# Patient Record
Sex: Male | Born: 1955 | Race: White | Hispanic: No | Marital: Married | Smoking: Current every day smoker
Health system: Southern US, Community
[De-identification: ages and names within clinical notes are randomized; demographics above are authoritative.]

## PROBLEM LIST (undated history)

## (undated) DIAGNOSIS — M1990 Unspecified osteoarthritis, unspecified site: Secondary | ICD-10-CM

## (undated) DIAGNOSIS — I10 Essential (primary) hypertension: Secondary | ICD-10-CM

## (undated) HISTORY — PX: CHOLECYSTECTOMY: SHX55

---

## 2018-10-23 ENCOUNTER — Inpatient Hospital Stay (HOSPITAL_COMMUNITY)
Admission: EM | Admit: 2018-10-23 | Discharge: 2018-10-25 | DRG: 641 | Disposition: A | Discharge status: Home / Self Care | Payer: No Typology Code available for payment source | Attending: Internal Medicine | Admitting: Internal Medicine

## 2018-10-23 ENCOUNTER — Emergency Department (HOSPITAL_COMMUNITY): Payer: No Typology Code available for payment source

## 2018-10-23 ENCOUNTER — Encounter (HOSPITAL_COMMUNITY): Payer: Self-pay | Admitting: Emergency Medicine

## 2018-10-23 ENCOUNTER — Other Ambulatory Visit: Payer: Self-pay

## 2018-10-23 DIAGNOSIS — F17210 Nicotine dependence, cigarettes, uncomplicated: Secondary | ICD-10-CM | POA: Diagnosis present

## 2018-10-23 DIAGNOSIS — R001 Bradycardia, unspecified: Secondary | ICD-10-CM | POA: Diagnosis present

## 2018-10-23 DIAGNOSIS — I10 Essential (primary) hypertension: Secondary | ICD-10-CM | POA: Diagnosis present

## 2018-10-23 DIAGNOSIS — E871 Hypo-osmolality and hyponatremia: Secondary | ICD-10-CM | POA: Diagnosis present

## 2018-10-23 DIAGNOSIS — E878 Other disorders of electrolyte and fluid balance, not elsewhere classified: Secondary | ICD-10-CM | POA: Diagnosis present

## 2018-10-23 DIAGNOSIS — Z1159 Encounter for screening for other viral diseases: Secondary | ICD-10-CM

## 2018-10-23 DIAGNOSIS — E861 Hypovolemia: Secondary | ICD-10-CM | POA: Diagnosis present

## 2018-10-23 DIAGNOSIS — N179 Acute kidney failure, unspecified: Secondary | ICD-10-CM

## 2018-10-23 DIAGNOSIS — R296 Repeated falls: Secondary | ICD-10-CM | POA: Diagnosis present

## 2018-10-23 DIAGNOSIS — E876 Hypokalemia: Secondary | ICD-10-CM

## 2018-10-23 DIAGNOSIS — E860 Dehydration: Secondary | ICD-10-CM | POA: Diagnosis present

## 2018-10-23 DIAGNOSIS — W19XXXA Unspecified fall, initial encounter: Secondary | ICD-10-CM

## 2018-10-23 DIAGNOSIS — K219 Gastro-esophageal reflux disease without esophagitis: Secondary | ICD-10-CM | POA: Diagnosis present

## 2018-10-23 HISTORY — DX: Essential (primary) hypertension: I10

## 2018-10-23 HISTORY — DX: Unspecified osteoarthritis, unspecified site: M19.90

## 2018-10-23 LAB — ETHANOL: Alcohol, Ethyl (B): <10 mg/dL (ref <10)

## 2018-10-23 LAB — CBC
HCT: 37.2 % — ABNORMAL LOW (ref 39.0–52.0)
Hemoglobin: 13.7 g/dL (ref 13.0–17.0)
MCH: 33.2 pg (ref 26.0–34.0)
MCHC: 36.8 g/dL — ABNORMAL HIGH (ref 30.0–36.0)
MCV: 90.1 fL (ref 80.0–100.0)
Platelets: 189 10*3/uL (ref 150–400)
RBC: 4.13 MIL/uL — ABNORMAL LOW (ref 4.22–5.81)
RDW: 11.7 % (ref 11.5–15.5)
WBC: 8.8 10*3/uL (ref 4.0–10.5)
nRBC: 0 % (ref 0.0–0.2)

## 2018-10-23 LAB — HEPATIC FUNCTION PANEL
ALT: 47 U/L — ABNORMAL HIGH (ref 0–44)
AST: 47 U/L — ABNORMAL HIGH (ref 15–41)
Albumin: 3.8 g/dL (ref 3.5–5.0)
Alkaline Phosphatase: 115 U/L (ref 38–126)
Bilirubin, Direct: 0.2 mg/dL (ref 0.0–0.2)
Indirect Bilirubin: 1 mg/dL — ABNORMAL HIGH (ref 0.3–0.9)
Total Bilirubin: 1.2 mg/dL (ref 0.3–1.2)
Total Protein: 6.5 g/dL (ref 6.5–8.1)

## 2018-10-23 LAB — LIPASE, BLOOD: Lipase: 21 U/L (ref 11–51)

## 2018-10-23 LAB — BASIC METABOLIC PANEL
Anion gap: 13 (ref 5–15)
BUN: 21 mg/dL (ref 8–23)
CO2: 30 mmol/L (ref 22–32)
Calcium: 8.8 mg/dL — ABNORMAL LOW (ref 8.9–10.3)
Chloride: 74 mmol/L — ABNORMAL LOW (ref 98–111)
Creatinine, Ser: 1.82 mg/dL — ABNORMAL HIGH (ref 0.61–1.24)
GFR calc Af Amer: 45 mL/min — ABNORMAL LOW (ref >60)
GFR calc non Af Amer: 39 mL/min — ABNORMAL LOW (ref >60)
Glucose, Bld: 110 mg/dL — ABNORMAL HIGH (ref 70–99)
Potassium: 2.7 mmol/L — CL (ref 3.5–5.1)
Sodium: 117 mmol/L — CL (ref 135–145)

## 2018-10-23 LAB — SARS CORONAVIRUS 2 BY RT PCR (HOSPITAL ORDER, PERFORMED IN ~~LOC~~ HOSPITAL LAB): SARS Coronavirus 2: NEGATIVE

## 2018-10-23 MED ORDER — SODIUM CHLORIDE 0.9 % IV BOLUS
1000.0000 mL | Freq: Once | INTRAVENOUS | Status: AC
  Administered 2018-10-23: 1000 mL via INTRAVENOUS

## 2018-10-23 MED ORDER — SODIUM CHLORIDE 0.9 % IV BOLUS
1000.0000 mL | Freq: Once | INTRAVENOUS | Status: AC
  Administered 2018-10-24: 1000 mL via INTRAVENOUS

## 2018-10-23 NOTE — H&P (Addendum)
History and Physical    Michael Carson XEN:[id number] DOB: 1955/12/11 DOA: 10/23/2018  PCP: Michael Son., MD Patient coming from: Home  Chief Complaint: Generalized weakness, falls  HPI: Michael Carson is a 63 y.o. male with medical history significant of arthritis, hypertension presenting to the hospital for evaluation of generalized weakness and falls.  Patient states for the past 1 week he has been feeling very weak and tired.  Today he fell 4 times and during 1 of the falls hit the back of his head.  Denies having any headaches or neck pain.  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  He did feel lightheaded today before falling.  States he has previously been told by his doctor that his sodium and potassium were low.  He was told his sodium was low because he drinks too much water.  Denies any fevers, chills, chest pain, shortness of breath, palpitations, nausea, vomiting, abdominal pain, or diarrhea.  Denies any seizures.  ED Course: Heart rate in the 40s to 50s, remainder of vitals stable.  No leukocytosis.  Hemoglobin normal.  Sodium 117.  Potassium 2.7.  Chloride 74.  Blood glucose 110.  BUN 21, creatinine 1.8.  No prior labs for comparison.  UA pending.  Blood ethanol level <10.  Lipase normal.  AST and ALT borderline elevated.  Alk phos and T bili normal.  COVID-19 rapid test negative.  Chest x-ray showing no active disease.  Head CT negative for acute finding.  Patient received a 1 L fluid bolus in the ED.  Review of Systems:  All systems reviewed and apart from history of presenting illness, are negative.  Past Medical History:  Diagnosis Date   Arthritis    Hypertension     Past Surgical History:  Procedure Laterality Date   CHOLECYSTECTOMY       reports that he has been smoking. He has been smoking about 2.00 packs per day. He has never used smokeless tobacco. He reports current alcohol use. He reports previous drug  use.  No Known Allergies  History reviewed. No pertinent family history.  Prior to Admission medications   Not on File    Physical Exam: Vitals:   10/23/18 2300 10/24/18 0015 10/24/18 0030 10/24/18 0100  BP: (!) 144/73 (!) 151/70 (!) 153/69 (!) 173/77  Pulse: (!) 51 (!) 52 (!) 46 (!) 49  Resp: '20 12 14 16  '$ Temp:      TempSrc:      SpO2: 100% 98% 97% 100%  Weight:      Height:        Physical Exam  Constitutional: He is oriented to person, place, and time. He appears well-developed and well-nourished. No distress.  HENT:  Head: Normocephalic.  Dry mucous membranes  Eyes: Right eye exhibits no discharge. Left eye exhibits no discharge.  Neck: Neck supple.  Cardiovascular: Normal rate, regular rhythm and intact distal pulses.  Pulmonary/Chest: Effort normal and breath sounds normal. No respiratory distress. He has no wheezes. He has no rales.  Abdominal: Soft. Bowel sounds are normal. He exhibits no distension. There is no abdominal tenderness. There is no guarding.  Musculoskeletal:        General: No edema.  Neurological: He is alert and oriented to person, place, and time.  Answering questions and following commands appropriately Strength 5 out of 5 in bilateral upper and lower extremities.  Skin: Skin is warm and dry. He is not diaphoretic.  Labs on Admission: I have personally reviewed following labs and imaging studies  CBC: Recent Labs  Lab 10/23/18 1943  WBC 8.8  HGB 13.7  HCT 37.2*  MCV 90.1  PLT 782   Basic Metabolic Panel: Recent Labs  Lab 10/23/18 1943 10/24/18 0115 10/24/18 0201  NA 117* 120* 122*  K 2.7* 2.6* 3.0*  CL 74* 81* 82*  CO2 '30 26 29  '$ GLUCOSE 110* 98 98  BUN '21 14 13  '$ CREATININE 1.82* 1.31* 1.26*  CALCIUM 8.8* 8.1* 8.5*  MG  --   --  1.5*   GFR: Estimated Creatinine Clearance: 62.7 mL/min (A) (by C-G formula based on SCr of 1.26 mg/dL (H)). Liver Function Tests: Recent Labs  Lab 10/23/18 2100  AST 47*  ALT 47*   ALKPHOS 115  BILITOT 1.2  PROT 6.5  ALBUMIN 3.8   Recent Labs  Lab 10/23/18 2100  LIPASE 21   No results for input(s): AMMONIA in the last 168 hours. Coagulation Profile: No results for input(s): INR, PROTIME in the last 168 hours. Cardiac Enzymes: No results for input(s): CKTOTAL, CKMB, CKMBINDEX, TROPONINI in the last 168 hours. BNP (last 3 results) No results for input(s): PROBNP in the last 8760 hours. HbA1C: No results for input(s): HGBA1C in the last 72 hours. CBG: No results for input(s): GLUCAP in the last 168 hours. Lipid Profile: No results for input(s): CHOL, HDL, LDLCALC, TRIG, CHOLHDL, LDLDIRECT in the last 72 hours. Thyroid Function Tests: Recent Labs    10/24/18 0201 10/24/18 0202  TSH  --  1.060  FREET4 1.33*  --    Anemia Panel: No results for input(s): VITAMINB12, FOLATE, FERRITIN, TIBC, IRON, RETICCTPCT in the last 72 hours. Urine analysis:    Component Value Date/Time   COLORURINE STRAW (A) 10/24/2018 0115   APPEARANCEUR CLEAR 10/24/2018 0115   LABSPEC 1.003 (L) 10/24/2018 0115   PHURINE 6.0 10/24/2018 0115   GLUCOSEU NEGATIVE 10/24/2018 0115   HGBUR SMALL (A) 10/24/2018 0115   BILIRUBINUR NEGATIVE 10/24/2018 0115   KETONESUR NEGATIVE 10/24/2018 0115   PROTEINUR NEGATIVE 10/24/2018 0115   NITRITE NEGATIVE 10/24/2018 0115   LEUKOCYTESUR NEGATIVE 10/24/2018 0115    Radiological Exams on Admission: Ct Head Wo Contrast  Result Date: 10/23/2018 CLINICAL DATA:  Altered LOC EXAM: CT HEAD WITHOUT CONTRAST TECHNIQUE: Contiguous axial images were obtained from the base of the skull through the vertex without intravenous contrast. COMPARISON:  None. FINDINGS: Brain: No acute territorial infarction, hemorrhage or intracranial mass. Patchy hypodensity in the periventricular and subcortical white matter consistent with small vessel ischemic change. Normal ventricle size. Vascular: No hyperdense vessels.  Carotid vascular calcification Skull: Normal. Negative  for fracture or focal lesion. Sinuses/Orbits: No acute finding. Other: None IMPRESSION: 1. No CT evidence for acute intracranial abnormality. 2. Scattered hypodensity in the bilateral white matter suspected to represent small vessel ischemic change Electronically Signed   By: Donavan Foil M.D.   On: 10/23/2018 22:18   Dg Chest Port 1 View  Result Date: 10/23/2018 CLINICAL DATA:  Weakness. EXAM: PORTABLE CHEST 1 VIEW COMPARISON:  CT dated June 29, 2008. FINDINGS: A metallic coil projects over the left mid chest. 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 There is slight increased density surrounding the AVM coils. There is no pneumothorax. No large pleural effusion. No acute osseous abnormality. The heart is stable. IMPRESSION: No active disease. Electronically Signed   By: Constance Holster M.D.   On: 10/23/2018  21:15    EKG: Independently reviewed.  Sinus bradycardia (heart rate 49), no prior EKG for comparison.  Assessment/Plan Principal Problem:   Hyponatremia Active Problems:   Hypokalemia   Hypomagnesemia   Hypochloremia   AKI (acute kidney injury) (HCC)   Severe hypotonic hypovolemic hyponatremia Sodium 117.  No altered mental status or seizures.  Serum osmolality 256, urine sodium 18, urine osmolality 123.  No vomiting or diarrhea.  Likely related to home thiazide diuretic use. -IV fluid hydration -Goal sodium correction 4 to 6 mEq in the next 24 hours. -Stop thiazide diuretic -Monitor BMP every 4 hours -Seizure precautions  Hypokalemia, hypomagnesemia No vomiting or diarrhea.  Hypokalemia likely related to diuretic use and hypomagnesemia.  Potassium 2.7, magnesium 1.5.  EKG without acute changes seen with hypokalemia. -Cardiac monitoring -Replete potassium.  Check magnesium level.  Continue to monitor BMP.  Hypochloremia Chloride 74.  Possibly related to diuretic use.  No vomiting. -IV fluid hydration.  Continue to  monitor.  AKI BUN 21, creatinine 1.8.  No prior labs for comparison.  Likely prerenal from dehydration. -IV fluid hydration -Avoid nephrotoxic agents -Continue to monitor renal function -Monitor urine output  Sinus bradycardia Heart rate in the 40s to 50s.  Unclear what his baseline is.  He is not on a beta-blocker or calcium channel blocker.  TSH 1.06 and free T4 1.33.  Likely also contributing to fatigue. -Cardiac monitoring  Falls, generalized weakness Likely related to severe hyponatremia and possibly due to bradycardia.  Head CT negative for acute finding. -PT evaluation  Hypertension Systolic in the 132G to 401U. -Hydralazine PRN -Stop thiazide diuretic given severe hyponatremia  GERD -PPI  DVT prophylaxis: Lovenox Code Status: Patient wishes to be full code. Family Communication: No family available at this time. Disposition Plan: Anticipate discharge after clinical improvement. Consults called: None Admission status: It is my clinical opinion that admission to INPATIENT is reasonable and necessary in this 63 y.o. male  presenting with severe hyponatremia, hypokalemia, AKI, bradycardia, generalized weakness, and falls.  Will need IV fluid hydration for gradual correction of sodium.  Given the aforementioned, the predictability of an adverse outcome is felt to be significant. I expect that the patient will require at least 2 midnights in the hospital to treat this condition.   The medical decision making on this patient was of high complexity and the patient is at high risk for clinical deterioration, therefore this is a level 3 visit.  Shela Leff MD [facility] Pager [phone]  If 7PM-7AM, please contact night-coverage [email or web address] Password [facility]  10/24/2018, 4:46 AM

## 2018-10-23 NOTE — ED Provider Notes (Signed)
[facility] Provider Note   CSN: [id number] Arrival date & time: 10/23/18  1936    History   Chief Complaint Chief Complaint  Patient presents with  . Weakness    HPI Michael Carson is a 63 y.o. male.     HPI Patient presents with weakness and falls. Patient has a history of hypertension, but states that he is generally well. He works as a Systems analystfurniture restorer. Today, he is felt progressively weak, though he notes that he had similar mild weakness earlier in the week. However, today with persistent mild generalized weakness without focality has had several falls. 1 of these included struck the back of his head, but there is no loss of consciousness. He notes that he is having difficulty with focusing, but does not feel overtly confused, denies a speech difficulty, though this has been reported by others to EMS providers. Today after a series of increasingly frequent falls, he was sent here for evaluation. He denies focal pain anywhere, including the back of his head, or neck.  Past Medical History:  Diagnosis Date  . Arthritis   . Hypertension     Patient Active Problem List   Diagnosis Date Noted  . Hyponatremia 10/23/2018    Past Surgical History:  Procedure Laterality Date  . CHOLECYSTECTOMY          Home Medications    Prior to Admission medications   Not on File    Family History History reviewed. No pertinent family history.  Social History Social History   Tobacco Use  . Smoking status: Current Every Day Smoker    Packs/day: 2.00  . Smokeless tobacco: Never Used  Substance Use Topics  . Alcohol use: Yes    Comment: occas  . Drug use: Not Currently     Allergies   Patient has no known allergies.   Review of Systems Review of Systems  Constitutional:       Per HPI, otherwise negative  HENT:       Per HPI, otherwise negative  Respiratory:       Per HPI, otherwise negative  Cardiovascular:     Per HPI, otherwise negative  Gastrointestinal: Negative for vomiting.  Endocrine:       Negative aside from HPI  Genitourinary:       Neg aside from HPI   Musculoskeletal:       Per HPI, otherwise negative  Skin: Negative.   Neurological: Positive for weakness. Negative for syncope.     Physical Exam Updated Vital Signs BP (!) 142/67   Pulse (!) 48   Temp 98.7 F (37.1 C) (Oral)   Resp 18   Ht 5\' 11"  (1.803 m)   Wt 73.9 kg   SpO2 98%   BMI 22.73 kg/m   Physical Exam Vitals signs and nursing note reviewed.  Constitutional:      General: He is not in acute distress.    Appearance: He is well-developed.  HENT:     Head: Normocephalic and atraumatic.  Eyes:     Conjunctiva/sclera: Conjunctivae normal.  Neck:     Musculoskeletal: No neck rigidity or muscular tenderness.  Cardiovascular:     Rate and Rhythm: Normal rate and regular rhythm.  Pulmonary:     Effort: Pulmonary effort is normal. No respiratory distress.     Breath sounds: No stridor.  Abdominal:     General: There is no distension.  Skin:    General: Skin is warm and dry.  Neurological:     Mental Status: He is alert and oriented to person, place, and time.     Cranial Nerves: Cranial nerves are intact.     Motor: Motor function is intact.     Coordination: Coordination is intact.     Comments: Patient confabulating, words are repetitive, storytelling is tangential.      ED Treatments / Results  Labs (all labs ordered are listed, but only abnormal results are displayed) Labs Reviewed  BASIC METABOLIC PANEL - Abnormal; Notable for the following components:      Result Value   Sodium 117 (*)    Potassium 2.7 (*)    Chloride 74 (*)    Glucose, Bld 110 (*)    Creatinine, Ser 1.82 (*)    Calcium 8.8 (*)    GFR calc non Af Amer 39 (*)    GFR calc Af Amer 45 (*)    All other components within normal limits  CBC - Abnormal; Notable for the following components:   RBC 4.13 (*)    HCT 37.2 (*)     MCHC 36.8 (*)    All other components within normal limits  HEPATIC FUNCTION PANEL - Abnormal; Notable for the following components:   AST 47 (*)    ALT 47 (*)    Indirect Bilirubin 1.0 (*)    All other components within normal limits  SARS CORONAVIRUS 2 (HOSPITAL ORDER, [place])  ETHANOL  LIPASE, BLOOD  URINALYSIS, ROUTINE W REFLEX MICROSCOPIC  BASIC METABOLIC PANEL    EKG EKG Interpretation  Date/Time:  Wednesday October 23 2018 19:37:19 EDT Ventricular Rate:  49 PR Interval:    QRS Duration: 106 QT Interval:  470 QTC Calculation: 425 R Axis:   60 Text Interpretation:  Sinus bradycardia Consider left atrial enlargement Anterior infarct, old Abnormal ECG Confirmed by Carmin Muskrat [phone]) on 10/23/2018 7:39:40 PM   Radiology Ct Head Wo Contrast  Result Date: 10/23/2018 CLINICAL DATA:  Altered LOC EXAM: CT HEAD WITHOUT CONTRAST TECHNIQUE: Contiguous axial images were obtained from the base of the skull through the vertex without intravenous contrast. COMPARISON:  None. FINDINGS: Brain: No acute territorial infarction, hemorrhage or intracranial mass. Patchy hypodensity in the periventricular and subcortical white matter consistent with small vessel ischemic change. Normal ventricle size. Vascular: No hyperdense vessels.  Carotid vascular calcification Skull: Normal. Negative for fracture or focal lesion. Sinuses/Orbits: No acute finding. Other: None IMPRESSION: 1. No CT evidence for acute intracranial abnormality. 2. Scattered hypodensity in the bilateral white matter suspected to represent small vessel ischemic change Electronically Signed   By: Donavan Foil M.D.   On: 10/23/2018 22:18   Dg Chest Port 1 View  Result Date: 10/23/2018 CLINICAL DATA:  Weakness. EXAM: PORTABLE CHEST 1 VIEW COMPARISON:  CT dated June 29, 2008. FINDINGS: A metallic coil projects over the left mid chest. This likely is related to the patient's previously noted pulmonary AVM on  a CT from 2010. There is slight increased density surrounding the AVM coils. There is no pneumothorax. No large pleural effusion. No acute osseous abnormality. The heart is stable. IMPRESSION: No active disease. Electronically Signed   By: Constance Holster M.D.   On: 10/23/2018 21:15    Procedures Procedures (including critical care time)  Medications Ordered in ED Medications  sodium chloride 0.9 % bolus 1,000 mL (1,000 mLs Intravenous New Bag/Given 10/23/18 2234)     Initial Impression / Assessment and Plan / ED Course  I have  reviewed the triage vital signs and the nursing notes.  Pertinent labs & imaging results that were available during my care of the patient were reviewed by me and considered in my medical decision making (see chart for details).    Initial labs notable for hyponatremia  No immediate clear etiology, additional labs pending.    11:08 PM Remaining labs concerning for elevated creatinine, minor elevation in potassium. On repeat exam is in similar condition, though appears somewhat more comfortable.  Chest x-ray, head CT both largely unremarkable.  Patient has received 2 L fluid resuscitation, will require additional labs to ensure ongoing sodium correction, with consideration of switch to more concentrated version should there not be substantial improvement. This adult male presents with progressive weakness over the past few days, including sufficient degree to contribute of falls. Patient has no overt evidence for trauma, and head CT is unremarkable. However, with critically abnormal sodium value, confusion, weakness, the patient required fluid resuscitation, will require ongoing as well, with close monitoring. Patient will require stepdown admission. Hospitalist aware the patient, patient ready for admission.   Final Clinical Impressions(s) / ED Diagnoses  Fall, initial encounter Hyponatremia  CRITICAL CARE Performed by: Gerhard Munchobert Syd Manges Total critical  care time: 35 minutes Critical care time was exclusive of separately billable procedures and treating other patients. Critical care was necessary to treat or prevent imminent or life-threatening deterioration. Critical care was time spent personally by me on the following activities: development of treatment plan with patient and/or surrogate as well as nursing, discussions with consultants, evaluation of patient's response to treatment, examination of patient, obtaining history from patient or surrogate, ordering and performing treatments and interventions, ordering and review of laboratory studies, ordering and review of radiographic studies, pulse oximetry and re-evaluation of patient's condition.    Gerhard MunchLockwood, Elliemae Braman, MD 10/23/18 2310

## 2018-10-23 NOTE — ED Notes (Signed)
Patient transported to CT 

## 2018-10-23 NOTE — ED Triage Notes (Signed)
BIB EMS from home with c/o of lower extremity weakness X 2 days. Pt reports he does work in a hot environment which may be a factor. States he use to drink but not much anymore. Reports drinking Sunday but nothing the past 2 days. A&O X 4. Only c/o is pain to left rib cage resulting from fall. VSS.

## 2018-10-24 DIAGNOSIS — N179 Acute kidney failure, unspecified: Secondary | ICD-10-CM

## 2018-10-24 DIAGNOSIS — E876 Hypokalemia: Secondary | ICD-10-CM

## 2018-10-24 DIAGNOSIS — E878 Other disorders of electrolyte and fluid balance, not elsewhere classified: Secondary | ICD-10-CM

## 2018-10-24 LAB — BASIC METABOLIC PANEL
Anion gap: 13 (ref 5–15)
Anion gap: 11 (ref 5–15)
Anion gap: 10 (ref 5–15)
Anion gap: 10 (ref 5–15)
BUN: 14 mg/dL (ref 8–23)
BUN: 13 mg/dL (ref 8–23)
BUN: 11 mg/dL (ref 8–23)
BUN: 11 mg/dL (ref 8–23)
CO2: 26 mmol/L (ref 22–32)
CO2: 29 mmol/L (ref 22–32)
CO2: 27 mmol/L (ref 22–32)
CO2: 27 mmol/L (ref 22–32)
Calcium: 8.1 mg/dL — ABNORMAL LOW (ref 8.9–10.3)
Calcium: 8.5 mg/dL — ABNORMAL LOW (ref 8.9–10.3)
Calcium: 8.5 mg/dL — ABNORMAL LOW (ref 8.9–10.3)
Calcium: 9.1 mg/dL (ref 8.9–10.3)
Chloride: 81 mmol/L — ABNORMAL LOW (ref 98–111)
Chloride: 82 mmol/L — ABNORMAL LOW (ref 98–111)
Chloride: 90 mmol/L — ABNORMAL LOW (ref 98–111)
Chloride: 94 mmol/L — ABNORMAL LOW (ref 98–111)
Creatinine, Ser: 1.31 mg/dL — ABNORMAL HIGH (ref 0.61–1.24)
Creatinine, Ser: 1.26 mg/dL — ABNORMAL HIGH (ref 0.61–1.24)
Creatinine, Ser: 1.11 mg/dL (ref 0.61–1.24)
Creatinine, Ser: 0.85 mg/dL (ref 0.61–1.24)
GFR calc Af Amer: >60 mL/min (ref >60)
GFR calc Af Amer: >60 mL/min (ref >60)
GFR calc Af Amer: >60 mL/min (ref >60)
GFR calc Af Amer: >60 mL/min (ref >60)
GFR calc non Af Amer: 58 mL/min — ABNORMAL LOW (ref >60)
GFR calc non Af Amer: >60 mL/min (ref >60)
GFR calc non Af Amer: >60 mL/min (ref >60)
GFR calc non Af Amer: >60 mL/min (ref >60)
Glucose, Bld: 98 mg/dL (ref 70–99)
Glucose, Bld: 98 mg/dL (ref 70–99)
Glucose, Bld: 182 mg/dL — ABNORMAL HIGH (ref 70–99)
Glucose, Bld: 114 mg/dL — ABNORMAL HIGH (ref 70–99)
Potassium: 2.6 mmol/L — CL (ref 3.5–5.1)
Potassium: 3 mmol/L — ABNORMAL LOW (ref 3.5–5.1)
Potassium: 3.7 mmol/L (ref 3.5–5.1)
Potassium: 3.6 mmol/L (ref 3.5–5.1)
Sodium: 120 mmol/L — ABNORMAL LOW (ref 135–145)
Sodium: 122 mmol/L — ABNORMAL LOW (ref 135–145)
Sodium: 127 mmol/L — ABNORMAL LOW (ref 135–145)
Sodium: 131 mmol/L — ABNORMAL LOW (ref 135–145)

## 2018-10-24 LAB — URINALYSIS, ROUTINE W REFLEX MICROSCOPIC
Bacteria, UA: NONE SEEN
Bilirubin Urine: NEGATIVE
Glucose, UA: NEGATIVE mg/dL
Ketones, ur: NEGATIVE mg/dL
Leukocytes,Ua: NEGATIVE
Nitrite: NEGATIVE
Protein, ur: NEGATIVE mg/dL
Specific Gravity, Urine: 1.003 — ABNORMAL LOW (ref 1.005–1.030)
pH: 6 (ref 5.0–8.0)

## 2018-10-24 LAB — OSMOLALITY, URINE: Osmolality, Ur: 123 mOsm/kg — ABNORMAL LOW (ref 300–900)

## 2018-10-24 LAB — OSMOLALITY: Osmolality: 256 mOsm/kg — ABNORMAL LOW (ref 275–295)

## 2018-10-24 LAB — TSH: TSH: 1.06 u[IU]/mL (ref 0.350–4.500)

## 2018-10-24 LAB — MAGNESIUM: Magnesium: 1.5 mg/dL — ABNORMAL LOW (ref 1.7–2.4)

## 2018-10-24 LAB — MRSA PCR SCREENING: MRSA by PCR: NEGATIVE

## 2018-10-24 LAB — T4, FREE: Free T4: 1.33 ng/dL — ABNORMAL HIGH (ref 0.61–1.12)

## 2018-10-24 LAB — SODIUM, URINE, RANDOM: Sodium, Ur: 18 mmol/L

## 2018-10-24 LAB — HIV ANTIBODY (ROUTINE TESTING W REFLEX): HIV Screen 4th Generation wRfx: NONREACTIVE

## 2018-10-24 MED ORDER — PANTOPRAZOLE SODIUM 40 MG PO TBEC
40.0000 mg | DELAYED_RELEASE_TABLET | Freq: Every day | ORAL | Status: DC
  Administered 2018-10-24 – 2018-10-25 (×2): 40 mg via ORAL
  Filled 2018-10-24 (×2): qty 1

## 2018-10-24 MED ORDER — POTASSIUM CHLORIDE CRYS ER 20 MEQ PO TBCR
40.0000 meq | EXTENDED_RELEASE_TABLET | ORAL | Status: AC
  Administered 2018-10-24 (×2): 40 meq via ORAL
  Filled 2018-10-24 (×2): qty 2

## 2018-10-24 MED ORDER — ADULT MULTIVITAMIN W/MINERALS CH
1.0000 | ORAL_TABLET | Freq: Every day | ORAL | Status: DC
  Administered 2018-10-24 – 2018-10-25 (×2): 1 via ORAL
  Filled 2018-10-24 (×2): qty 1

## 2018-10-24 MED ORDER — HYDRALAZINE HCL 20 MG/ML IJ SOLN: 5.0000 mg | INTRAMUSCULAR | Status: DC | PRN

## 2018-10-24 MED ORDER — ACETAMINOPHEN 325 MG PO TABS
650.0000 mg | ORAL_TABLET | Freq: Four times a day (QID) | ORAL | Status: DC | PRN
  Administered 2018-10-24: 650 mg via ORAL
  Filled 2018-10-24: qty 2

## 2018-10-24 MED ORDER — ENOXAPARIN SODIUM 40 MG/0.4ML ~~LOC~~ SOLN
40.0000 mg | SUBCUTANEOUS | Status: DC
  Administered 2018-10-24: 40 mg via SUBCUTANEOUS
  Filled 2018-10-24: qty 0.4

## 2018-10-24 MED ORDER — ASPIRIN EC 81 MG PO TBEC
81.0000 mg | DELAYED_RELEASE_TABLET | Freq: Every day | ORAL | Status: DC
  Administered 2018-10-24 – 2018-10-25 (×2): 81 mg via ORAL
  Filled 2018-10-24 (×2): qty 1

## 2018-10-24 MED ORDER — ACETAMINOPHEN 650 MG RE SUPP: 650.0000 mg | Freq: Four times a day (QID) | RECTAL | Status: DC | PRN

## 2018-10-24 MED ORDER — AMLODIPINE BESYLATE 10 MG PO TABS
10.0000 mg | ORAL_TABLET | Freq: Every day | ORAL | Status: DC
  Administered 2018-10-24 – 2018-10-25 (×2): 10 mg via ORAL
  Filled 2018-10-24 (×3): qty 1

## 2018-10-24 MED ORDER — SODIUM CHLORIDE 0.9 % IV SOLN
INTRAVENOUS | Status: AC
  Administered 2018-10-24 (×3): via INTRAVENOUS

## 2018-10-24 MED ORDER — SODIUM CHLORIDE 1 G PO TABS
1.0000 g | ORAL_TABLET | Freq: Three times a day (TID) | ORAL | Status: DC
  Filled 2018-10-24: qty 1

## 2018-10-24 MED ORDER — ALPRAZOLAM 0.25 MG PO TABS
0.2500 mg | ORAL_TABLET | Freq: Two times a day (BID) | ORAL | Status: DC | PRN
  Administered 2018-10-24 – 2018-10-25 (×2): 0.25 mg via ORAL
  Filled 2018-10-24 (×2): qty 1

## 2018-10-24 NOTE — Progress Notes (Signed)
PROGRESS NOTE    Michael Carson  ZOX:[id number] DOB: 11/07/1955 DOA: 10/23/2018 PCP: Ignacia PalmaBeck, Mark C., MD   Brief Narrative: Patient is a 63 year old male with history of arthritis, hypertension who presents the emergency department complains of generalized weakness and falls.  Reported that he fell 4 times and hit the back of his head.  Denied any headache or neck pain on presentation.  Works as a Web designerfurniture discharge.  Patient reports that he drinks a lot of water.  He was reported by his PCP earlier that his sodium and potassium were low.  When he presented he was found to be mildly bradycardic with sodium of 117.  Potassium of 2.7.  Patient admitted for further management.  Assessment & Plan:   Principal Problem:   Hyponatremia Active Problems:   Hypokalemia   Hypomagnesemia   Hypochloremia   AKI (acute kidney injury) (HCC)   Severe hypotonic hypovolemic hyponatremia: Low plasma and urine osmolality indicating hypovolemic hyponatremia. Responding very well to the IV fluids normal saline. Sodium is 127 today.  No history of vomiting, diarrhea.  Was taking thiazide diuretic at home.  Will discontinue thiazide on discharge. Monitor BMP every 4 hours.  Hypokalemia/hypomagnesemia: Supplemented and corrected  AKI: Secondary to dehydration.  Improving with IV fluids  Sinus bradycardia: Heart rate in the range of 50s.  Not on beta-blocker or calcium.  Continue to monitor.  No heart block as per EKG.  Falls/generalized weakness: Requested physical therapy evaluation.  No follow-up recommended.  Hypertension: Monitor blood pressure.  Will stop thiazide and put him on amlodipine.  GERD: Continue PPI         DVT prophylaxis: Lovenox Code Status: Full Family Communication: Patient communicating with family Disposition Plan: Home tomorrow   Consultants: None  Procedures: None  Antimicrobials:  Anti-infectives (From admission, onward)   None      Subjective: Patient seen and  examined the bedside this morning.  Mildly hypertensive during my evaluation.  Complains of some lightheadedness.  Was sleepy/drowsy during my evaluation.  Denies any specific complaints.  Objective: Vitals:   10/24/18 0100 10/24/18 0326 10/24/18 0719 10/24/18 1120  BP: (!) 173/77 (!) 184/75 (!) 163/60   Pulse: (!) 49 (!) 58  (!) 57  Resp: 16     Temp:  98.2 F (36.8 C) 98.2 F (36.8 C) 98.6 F (37 C)  TempSrc:  Oral Axillary Oral  SpO2: 100% 100%  97%  Weight:      Height:        Intake/Output Summary (Last 24 hours) at 10/24/2018 1139 Last data filed at 10/24/2018 1121 Gross per 24 hour  Intake 600.4 ml  Output 1975 ml  Net -1374.6 ml   Filed Weights   10/23/18 1941  Weight: 73.9 kg    Examination:  General exam: Appears calm and comfortable ,Not in distress,average built HEENT:PERRL,Oral mucosa moist, Ear/Nose normal on gross exam Respiratory system: Bilateral equal air entry, normal vesicular breath sounds, no wheezes or crackles  Cardiovascular system: Bradycardia. No JVD, murmurs, rubs, gallops or clicks. No pedal edema. Gastrointestinal system: Abdomen is nondistended, soft and nontender. No organomegaly or masses felt. Normal bowel sounds heard. Central nervous system: Alert and oriented. No focal neurological deficits. Extremities: No edema, no clubbing ,no cyanosis, distal peripheral pulses palpable. Skin: No rashes, lesions or ulcers,no icterus ,no pallor MSK: Normal muscle bulk,tone ,power Psychiatry: Judgement and insight appear normal. Mood & affect appropriate.     Data Reviewed: I have personally reviewed following labs and imaging studies  CBC: Recent Labs  Lab 10/23/18 1943  WBC 8.8  HGB 13.7  HCT 37.2*  MCV 90.1  PLT 371   Basic Metabolic Panel: Recent Labs  Lab 10/23/18 1943 10/24/18 0115 10/24/18 0201 10/24/18 0844  NA 117* 120* 122* 127*  K 2.7* 2.6* 3.0* 3.7  CL 74* 81* 82* 90*  CO2 30 26 29 27   GLUCOSE 110* 98 98 182*  BUN 21 14  13 11   CREATININE 1.82* 1.31* 1.26* 1.11  CALCIUM 8.8* 8.1* 8.5* 8.5*  MG  --   --  1.5*  --    GFR: Estimated Creatinine Clearance: 71.2 mL/min (by C-G formula based on SCr of 1.11 mg/dL). Liver Function Tests: Recent Labs  Lab 10/23/18 2100  AST 47*  ALT 47*  ALKPHOS 115  BILITOT 1.2  PROT 6.5  ALBUMIN 3.8   Recent Labs  Lab 10/23/18 2100  LIPASE 21   No results for input(s): AMMONIA in the last 168 hours. Coagulation Profile: No results for input(s): INR, PROTIME in the last 168 hours. Cardiac Enzymes: No results for input(s): CKTOTAL, CKMB, CKMBINDEX, TROPONINI in the last 168 hours. BNP (last 3 results) No results for input(s): PROBNP in the last 8760 hours. HbA1C: No results for input(s): HGBA1C in the last 72 hours. CBG: No results for input(s): GLUCAP in the last 168 hours. Lipid Profile: No results for input(s): CHOL, HDL, LDLCALC, TRIG, CHOLHDL, LDLDIRECT in the last 72 hours. Thyroid Function Tests: Recent Labs    10/24/18 0201 10/24/18 0202  TSH  --  1.060  FREET4 1.33*  --    Anemia Panel: No results for input(s): VITAMINB12, FOLATE, FERRITIN, TIBC, IRON, RETICCTPCT in the last 72 hours. Sepsis Labs: No results for input(s): PROCALCITON, LATICACIDVEN in the last 168 hours.  Recent Results (from the past 240 hour(s))  SARS Coronavirus 2 (CEPHEID - Performed in [place] hospital lab), Hosp Order     Status: None   Collection Time: 10/23/18  9:18 PM   Specimen: Nasopharyngeal Swab  Result Value Ref Range Status   SARS Coronavirus 2 NEGATIVE NEGATIVE Final    Comment: (NOTE) If result is NEGATIVE SARS-CoV-2 target nucleic acids are NOT DETECTED. The SARS-CoV-2 RNA is generally detectable in upper and lower  respiratory specimens during the acute phase of infection. The lowest  concentration of SARS-CoV-2 viral copies this assay can detect is 250  copies / mL. A negative result does not preclude SARS-CoV-2 infection  and should not be used as  the sole basis for treatment or other  patient management decisions.  A negative result may occur with  improper specimen collection / handling, submission of specimen other  than nasopharyngeal swab, presence of viral mutation(s) within the  areas targeted by this assay, and inadequate number of viral copies  (<250 copies / mL). A negative result must be combined with clinical  observations, patient history, and epidemiological information. If result is POSITIVE SARS-CoV-2 target nucleic acids are DETECTED. The SARS-CoV-2 RNA is generally detectable in upper and lower  respiratory specimens dur ing the acute phase of infection.  Positive  results are indicative of active infection with SARS-CoV-2.  Clinical  correlation with patient history and other diagnostic information is  necessary to determine patient infection status.  Positive results do  not rule out bacterial infection or co-infection with other viruses. If result is PRESUMPTIVE POSTIVE SARS-CoV-2 nucleic acids MAY BE PRESENT.   A presumptive positive result was obtained on the submitted specimen  and confirmed on  repeat testing.  While 2019 novel coronavirus  (SARS-CoV-2) nucleic acids may be present in the submitted sample  additional confirmatory testing may be necessary for epidemiological  and / or clinical management purposes  to differentiate between  SARS-CoV-2 and other Sarbecovirus currently known to infect humans.  If clinically indicated additional testing with an alternate test  methodology [phone]) is advised. The SARS-CoV-2 RNA is generally  detectable in upper and lower respiratory sp ecimens during the acute  phase of infection. The expected result is Negative. Fact Sheet for Patients:  BoilerBrush.com.cyhttps://www.fda.gov/media/136312/download Fact Sheet for Healthcare Providers: https://pope.com/https://www.fda.gov/media/136313/download This test is not yet approved or cleared by the [place] FDA and has been authorized for  detection and/or diagnosis of SARS-CoV-2 by FDA under an Emergency Use Authorization (EUA).  This EUA will remain in effect (meaning this test can be used) for the duration of the COVID-19 declaration under Section 564(b)(1) of the Act, 21 U.S.C. section 360bbb-3(b)(1), unless the authorization is terminated or revoked sooner. Performed at [facility], [address] [place], [place] [address]   MRSA PCR Screening     Status: None   Collection Time: 10/24/18  3:34 AM   Specimen: Nasal Mucosa; Nasopharyngeal  Result Value Ref Range Status   MRSA by PCR NEGATIVE NEGATIVE Final    Comment:        The GeneXpert MRSA Assay (FDA approved for NASAL specimens only), is one component of a comprehensive MRSA colonization surveillance program. It is not intended to diagnose MRSA infection nor to guide or monitor treatment for MRSA infections. Performed at [facility], [address] [place], [place] [address]          Radiology Studies: Ct Head Wo Contrast  Result Date: 10/23/2018 CLINICAL DATA:  Altered LOC EXAM: CT HEAD WITHOUT CONTRAST TECHNIQUE: Contiguous axial images were obtained from the base of the skull through the vertex without intravenous contrast. COMPARISON:  None. FINDINGS: Brain: No acute territorial infarction, hemorrhage or intracranial mass. Patchy hypodensity in the periventricular and subcortical white matter consistent with small vessel ischemic change. Normal ventricle size. Vascular: No hyperdense vessels.  Carotid vascular calcification Skull: Normal. Negative for fracture or focal lesion. Sinuses/Orbits: No acute finding. Other: None IMPRESSION: 1. No CT evidence for acute intracranial abnormality. 2. Scattered hypodensity in the bilateral white matter suspected to represent small vessel ischemic change Electronically Signed   By: Jasmine PangKim  Fujinaga M.D.   On: 10/23/2018 22:18   Dg Chest Port 1 View  Result Date: 10/23/2018 CLINICAL DATA:  Weakness.  EXAM: PORTABLE CHEST 1 VIEW COMPARISON:  CT dated June 29, 2008. FINDINGS: A metallic coil projects over the left mid chest. 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 There is slight increased density surrounding the AVM coils. There is no pneumothorax. No large pleural effusion. No acute osseous abnormality. The heart is stable. IMPRESSION: No active disease. Electronically Signed   By: Katherine Mantlehristopher  Green M.D.   On: 10/23/2018 21:15        Scheduled Meds:  aspirin EC  81 mg Oral Daily   enoxaparin (LOVENOX) injection  40 mg Subcutaneous Q24H   multivitamin with minerals  1 tablet Oral Daily   pantoprazole  40 mg Oral Daily   Continuous Infusions:  sodium chloride 125 mL/hr at 10/24/18 0934     LOS: 1 day    Time spent: 35 mins.More than 50% of that time was spent in counseling and/or  coordination of care.      Burnadette PopAmrit Asyria Kolander, MD [facility] Pager [phone]  If 7PM-7AM, please contact night-coverage [email or web address] Password [email or web address] 10/24/2018, 11:39 AM

## 2018-10-24 NOTE — Plan of Care (Signed)

## 2018-10-24 NOTE — ED Notes (Signed)
ED TO INPATIENT HANDOFF REPORT  ED Nurse Name and Phone #: Ben [phone]  S Name/Age/Gender Michael Carson 63 y.o. male Room/Bed: 035C/035C  Code Status   Code Status: Full Code  Home/SNF/Other Home Patient oriented to: self, place, time and situation Is this baseline? Yes   Triage Complete: Triage complete  Chief Complaint weakness  Triage Note BIB EMS from home with c/o of lower extremity weakness X 2 days. Pt reports he does work in a hot environment which may be a factor. States he use to drink but not much anymore. Reports drinking Sunday but nothing the past 2 days. A&O X 4. Only c/o is pain to left rib cage resulting from fall. VSS.    Allergies No Known Allergies  Level of Care/Admitting Diagnosis ED Disposition    ED Disposition Condition Comment   Admit  [facility] [[id number]]  Level of Care: Progressive [102]  Covid Evaluation: Confirmed COVID Negative  Diagnosis: Hyponatremia [[id number]][[id number]]  Admitting Physician: John GiovanniATHORE, VASUNDHRA [[id number]]  Attending Physician: John GiovanniATHORE, VASUNDHRA [[id number]]  Estimated length of stay: past midnight tomorrow  Certification:: I certify this patient will need inpatient services for at least 2 midnights  PT Class (Do Not Modify): Inpatient [101]  PT Acc Code (Do Not Modify): Private [1]       B Medical/Surgery History Past Medical History:  Diagnosis Date  . Arthritis   . Hypertension    Past Surgical History:  Procedure Laterality Date  . CHOLECYSTECTOMY       A IV Location/Drains/Wounds Patient Lines/Drains/Airways Status   Active Line/Drains/Airways    Name:   Placement date:   Placement time:   Site:   Days:   Peripheral IV 10/23/18 Left;Upper Arm   10/23/18    1946    Arm   1   Peripheral IV 10/24/18 Left Forearm   10/24/18    0113    Forearm   less than 1          Intake/Output Last 24 hours No intake or output data in the 24 hours ending 10/24/18 0142  Labs/Imaging Results for  orders placed or performed during the hospital encounter of 10/23/18 (from the past 48 hour(s))  Basic metabolic panel     Status: Abnormal   Collection Time: 10/23/18  7:43 PM  Result Value Ref Range   Sodium 117 (LL) 135 - 145 mmol/L    Comment: CRITICAL RESULT CALLED TO, READ BACK BY AND VERIFIED WITH: B BAILEE,RN 2039 10/23/2018 WBOND    Potassium 2.7 (LL) 3.5 - 5.1 mmol/L    Comment: CRITICAL RESULT CALLED TO, READ BACK BY AND VERIFIED WITH: B BAILEE,RN 2040 10/23/2018 WBOND    Chloride 74 (L) 98 - 111 mmol/L   CO2 30 22 - 32 mmol/L   Glucose, Bld 110 (H) 70 - 99 mg/dL   BUN 21 8 - 23 mg/dL   Creatinine, Ser 8.411.82 (H) 0.61 - 1.24 mg/dL   Calcium 8.8 (L) 8.9 - 10.3 mg/dL   GFR calc non Af Amer 39 (L) >60 mL/min   GFR calc Af Amer 45 (L) >60 mL/min   Anion gap 13 5 - 15    Comment: Performed at [facility], [address] [place], [place] [address]  CBC     Status: Abnormal   Collection Time: 10/23/18  7:43 PM  Result Value Ref Range   WBC 8.8 4.0 - 10.5 K/uL   RBC 4.13 (L) 4.22 - 5.81 MIL/uL  Hemoglobin 13.7 13.0 - 17.0 g/dL   HCT 96.037.2 (L) 45.439.0 - 09.852.0 %   MCV 90.1 80.0 - 100.0 fL   MCH 33.2 26.0 - 34.0 pg   MCHC 36.8 (H) 30.0 - 36.0 g/dL   RDW 11.911.7 14.711.5 - 82.915.5 %   Platelets 189 150 - 400 K/uL   nRBC 0.0 0.0 - 0.2 %    Comment: Performed at [facility], [address] [place], [place] [address]  Ethanol     Status: None   Collection Time: 10/23/18  9:00 PM  Result Value Ref Range   Alcohol, Ethyl (B) <10 <10 mg/dL    Comment: (NOTE) Lowest detectable limit for serum alcohol is 10 mg/dL. For medical purposes only. Performed at [facility], [address] [place], [place] [address]   Lipase, blood     Status: None   Collection Time: 10/23/18  9:00 PM  Result Value Ref Range   Lipase 21 11 - 51 U/L    Comment: Performed at [facility], [address] [place], [place] [address]  Hepatic function panel     Status: Abnormal   Collection  Time: 10/23/18  9:00 PM  Result Value Ref Range   Total Protein 6.5 6.5 - 8.1 g/dL   Albumin 3.8 3.5 - 5.0 g/dL   AST 47 (H) 15 - 41 U/L   ALT 47 (H) 0 - 44 U/L   Alkaline Phosphatase 115 38 - 126 U/L   Total Bilirubin 1.2 0.3 - 1.2 mg/dL   Bilirubin, Direct 0.2 0.0 - 0.2 mg/dL   Indirect Bilirubin 1.0 (H) 0.3 - 0.9 mg/dL    Comment: Performed at [facility], [address] [place], [place] [address]  SARS Coronavirus 2 (CEPHEID - Performed in [facility] hospital lab), Hosp Order     Status: None   Collection Time: 10/23/18  9:18 PM   Specimen: Nasopharyngeal Swab  Result Value Ref Range   SARS Coronavirus 2 NEGATIVE NEGATIVE    Comment: (NOTE) If result is NEGATIVE SARS-CoV-2 target nucleic acids are NOT DETECTED. The SARS-CoV-2 RNA is generally detectable in upper and lower  respiratory specimens during the acute phase of infection. The lowest  concentration of SARS-CoV-2 viral copies this assay can detect is 250  copies / mL. A negative result does not preclude SARS-CoV-2 infection  and should not be used as the sole basis for treatment or other  patient management decisions.  A negative result may occur with  improper specimen collection / handling, submission of specimen other  than nasopharyngeal swab, presence of viral mutation(s) within the  areas targeted by this assay, and inadequate number of viral copies  (<250 copies / mL). A negative result must be combined with clinical  observations, patient history, and epidemiological information. If result is POSITIVE SARS-CoV-2 target nucleic acids are DETECTED. The SARS-CoV-2 RNA is generally detectable in upper and lower  respiratory specimens dur ing the acute phase of infection.  Positive  results are indicative of active infection with SARS-CoV-2.  Clinical  correlation with patient history and other diagnostic information is  necessary to determine patient infection status.  Positive results do  not rule out  bacterial infection or co-infection with other viruses. If result is PRESUMPTIVE POSTIVE SARS-CoV-2 nucleic acids MAY BE PRESENT.   A presumptive positive result was obtained on the submitted specimen  and confirmed on repeat testing.  While 2019 novel coronavirus  (SARS-CoV-2) nucleic acids may  be present in the submitted sample  additional confirmatory testing may be necessary for epidemiological  and / or clinical management purposes  to differentiate between  SARS-CoV-2 and other Sarbecovirus currently known to infect humans.  If clinically indicated additional testing with an alternate test  methodology [phone]) is advised. The SARS-CoV-2 RNA is generally  detectable in upper and lower respiratory sp ecimens during the acute  phase of infection. The expected result is Negative. Fact Sheet for Patients:  StrictlyIdeas.no Fact Sheet for Healthcare Providers: BankingDealers.co.za This test is not yet approved or cleared by the [place] FDA and has been authorized for detection and/or diagnosis of SARS-CoV-2 by FDA under an Emergency Use Authorization (EUA).  This EUA will remain in effect (meaning this test can be used) for the duration of the COVID-19 declaration under Section 564(b)(1) of the Act, 21 U.S.C. section 360bbb-3(b)(1), unless the authorization is terminated or revoked sooner. Performed at [facility], [place] [address] [place], [place] [address]   Urinalysis, Routine w reflex microscopic     Status: Abnormal   Collection Time: 10/24/18  1:15 AM  Result Value Ref Range   Color, Urine STRAW (A) YELLOW   APPearance CLEAR CLEAR   Specific Gravity, Urine 1.003 (L) 1.005 - 1.030   pH 6.0 5.0 - 8.0   Glucose, UA NEGATIVE NEGATIVE mg/dL   Hgb urine dipstick SMALL (A) NEGATIVE   Bilirubin Urine NEGATIVE NEGATIVE   Ketones, ur NEGATIVE NEGATIVE mg/dL   Protein, ur NEGATIVE NEGATIVE mg/dL   Nitrite NEGATIVE NEGATIVE    Leukocytes,Ua NEGATIVE NEGATIVE   RBC / HPF 0-5 0 - 5 RBC/hpf   WBC, UA 0-5 0 - 5 WBC/hpf   Bacteria, UA NONE SEEN NONE SEEN   Squamous Epithelial / LPF 0-5 0 - 5    Comment: Performed at [place] [address], [place], [place] [address]   Ct Head Wo Contrast  Result Date: 10/23/2018 CLINICAL DATA:  Altered LOC EXAM: CT HEAD WITHOUT CONTRAST TECHNIQUE: Contiguous axial images were obtained from the base of the skull through the vertex without intravenous contrast. COMPARISON:  None. FINDINGS: Brain: No acute territorial infarction, hemorrhage or intracranial mass. Patchy hypodensity in the periventricular and subcortical white matter consistent with small vessel ischemic change. Normal ventricle size. Vascular: No hyperdense vessels.  Carotid vascular calcification Skull: Normal. Negative for fracture or focal lesion. Sinuses/Orbits: No acute finding. Other: None IMPRESSION: 1. No CT evidence for acute intracranial abnormality. 2. Scattered hypodensity in the bilateral white matter suspected to represent small vessel ischemic change Electronically Signed   By: Donavan Foil M.D.   On: 10/23/2018 22:18   Dg Chest Port 1 View  Result Date: 10/23/2018 CLINICAL DATA:  Weakness. EXAM: PORTABLE CHEST 1 VIEW COMPARISON:  CT dated June 29, 2008. FINDINGS: A metallic coil projects over the left mid chest. 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 There is slight increased density surrounding the AVM coils. There is no pneumothorax. No large pleural effusion. No acute osseous abnormality. The heart is stable. IMPRESSION: No active disease. Electronically Signed   By: Constance Holster M.D.   On: 10/23/2018 21:15    Pending Labs Unresulted Labs (From admission, onward)    Start     Ordered   10/24/18 8242  Basic metabolic panel  Now then every 4 hours,   R (with STAT occurrences)     10/24/18 0110   10/24/18 0110  Magnesium  Add-on,   AD      10/24/18 0110   10/24/18 0109  Osmolality  Once,   STAT     10/24/18 0110   10/24/18 0109  Sodium, urine, random  Once,   STAT     10/24/18 0110   10/24/18 0109  Osmolality, urine  Once,   STAT     10/24/18 0110   10/24/18 0109  TSH  Once,   STAT     10/24/18 0110   10/24/18 0109  T4, free  Once,   STAT     10/24/18 0110   10/24/18 0107  HIV antibody (Routine Testing)  Once,   STAT     10/24/18 0110   10/23/18 2306  Basic metabolic panel  ONCE - STAT,   STAT    Comments: Please collect after 1 L fluid bolus.    10/23/18 2306          Vitals/Pain Today's Vitals   10/23/18 2145 10/23/18 2245 10/23/18 2300 10/24/18 0015  BP: (!) 142/67 (!) 149/73 (!) 144/73 (!) 151/70  Pulse: (!) 48 (!) 49 (!) 51 (!) 52  Resp: 18 17 20 12   Temp:      TempSrc:      SpO2: 98% 98% 100% 98%  Weight:      Height:      PainSc:        Isolation Precautions No active isolations  Medications Medications  0.9 %  sodium chloride infusion (has no administration in time range)  aspirin EC tablet 81 mg (has no administration in time range)  pantoprazole (PROTONIX) EC tablet 40 mg (has no administration in time range)  multivitamin with minerals tablet 1 tablet (has no administration in time range)  enoxaparin (LOVENOX) injection 40 mg (has no administration in time range)  acetaminophen (TYLENOL) tablet 650 mg (has no administration in time range)    Or  acetaminophen (TYLENOL) suppository 650 mg (has no administration in time range)  potassium chloride SA (K-DUR) CR tablet 40 mEq (has no administration in time range)  sodium chloride 0.9 % bolus 1,000 mL (1,000 mLs Intravenous New Bag/Given 10/23/18 2234)  sodium chloride 0.9 % bolus 1,000 mL (0 mLs Intravenous Stopped 10/24/18 0030)    Mobility walks with person assist High fall risk   Focused Assessments Hyponatremia   R Recommendations: See Admitting Provider Note  Report given to:   Additional Notes: N/A

## 2018-10-24 NOTE — Evaluation (Signed)
Physical Therapy Evaluation Patient Details Name: Michael Carson MRN: [id number] DOB: Sep 11, 1955 Today's Date: 10/24/2018   History of Present Illness  Michael Carson is a 63 y.o. male with medical history significant of arthritis, hypertension presenting to the hospital for evaluation of generalized weakness and falls.  Patient states for the past 1 week he has been feeling very weak and tired.  Today he fell 4 times and during 1 of the falls hit the back of his head.  Denies having any headaches or neck pain.  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   Clinical Impression  Pt admitted with above diagnosis. Pt currently with functional limitations due to the deficits listed below (see PT Problem List). Pt was able to ambulate with supervision in hallway without device.  Will follow acutely to ensure that pt maintains mobility.   Pt will benefit from skilled PT to increase their independence and safety with mobility to allow discharge to the venue listed below.      Follow Up Recommendations No PT follow up    Equipment Recommendations  None recommended by PT    Recommendations for Other Services       Precautions / Restrictions Precautions Precautions: Fall Restrictions Weight Bearing Restrictions: No      Mobility  Bed Mobility Overal bed mobility: Independent                Transfers Overall transfer level: Independent                  Ambulation/Gait Ambulation/Gait assistance: Supervision Gait Distance (Feet): 500 Feet Assistive device: None Gait Pattern/deviations: WFL(Within Functional Limits)   Gait velocity interpretation: >2.62 ft/sec, indicative of community ambulatory General Gait Details: No issues with balance with challenges to balance.    Stairs            Wheelchair Mobility    Modified Rankin (Stroke Patients Only)       Balance Overall balance assessment: Independent                                            Pertinent Vitals/Pain Pain Assessment: No/denies pain    Home Living Family/patient expects to be discharged to:: Private residence Living Arrangements: Spouse/significant other Available Help at Discharge: Family;Available 24 hours/day(wife is retired) Type of Home: House Home Access: Stairs to enter Entrance Stairs-Rails: Right;Left;Can reach both Technical brewer of Steps: 3 Home Layout: One level Home Equipment: Environmental consultant - 2 wheels;Bedside commode      Prior Function Level of Independence: Independent         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, newspaper        Extremity/Trunk Assessment   Upper Extremity Assessment Upper Extremity Assessment: Defer to OT evaluation    Lower Extremity Assessment Lower Extremity Assessment: Generalized weakness    Cervical / Trunk Assessment Cervical / Trunk Assessment: Normal  Communication   Communication: No difficulties  Cognition Arousal/Alertness: Awake/alert Behavior During Therapy: WFL for tasks assessed/performed Overall Cognitive Status: Within Functional Limits for tasks assessed                                        General Comments  Exercises     Assessment/Plan    PT Assessment Patient needs continued PT services  PT Problem List Decreased activity tolerance;Decreased balance;Decreased mobility;Decreased knowledge of use of DME;Decreased safety awareness;Decreased knowledge of precautions       PT Treatment Interventions DME instruction;Gait training;Functional mobility training;Stair training;Therapeutic activities;Therapeutic exercise;Balance training;Patient/family education    PT Goals (Current goals can be found in the Care Plan section)  Acute Rehab PT Goals Patient Stated Goal: to go home PT Goal Formulation: With patient Time For Goal Achievement: 11/07/18 Potential to Achieve Goals:  Good    Frequency Min 3X/week   Barriers to discharge        Co-evaluation               AM-PAC PT "6 Clicks" Mobility  Outcome Measure Help needed turning from your back to your side while in a flat bed without using bedrails?: None Help needed moving from lying on your back to sitting on the side of a flat bed without using bedrails?: None Help needed moving to and from a bed to a chair (including a wheelchair)?: None Help needed standing up from a chair using your arms (e.g., wheelchair or bedside chair)?: None Help needed to walk in hospital room?: A Little Help needed climbing 3-5 steps with a railing? : A Little 6 Click Score: 22    End of Session Equipment Utilized During Treatment: Gait belt Activity Tolerance: Patient tolerated treatment well Patient left: in chair;with call bell/phone within reach Nurse Communication: Mobility status PT Visit Diagnosis: Muscle weakness (generalized) (M62.81)    Time: [id number] PT Time Calculation (min) (ACUTE ONLY): 19 min   Charges:   PT Evaluation $PT Eval Moderate Complexity: 1 Mod          Lathan Gieselman,PT Acute Rehabilitation Services Pager:  [phone]  Office:  [phone]    Berline LopesDawn F Needham Biggins 10/24/2018, 11:05 AM

## 2018-10-25 LAB — BASIC METABOLIC PANEL
Anion gap: 9 (ref 5–15)
BUN: 10 mg/dL (ref 8–23)
CO2: 28 mmol/L (ref 22–32)
Calcium: 9.1 mg/dL (ref 8.9–10.3)
Chloride: 94 mmol/L — ABNORMAL LOW (ref 98–111)
Creatinine, Ser: 0.86 mg/dL (ref 0.61–1.24)
GFR calc Af Amer: >60 mL/min (ref >60)
GFR calc non Af Amer: >60 mL/min (ref >60)
Glucose, Bld: 89 mg/dL (ref 70–99)
Potassium: 3.2 mmol/L — ABNORMAL LOW (ref 3.5–5.1)
Sodium: 131 mmol/L — ABNORMAL LOW (ref 135–145)

## 2018-10-25 MED ORDER — POTASSIUM CHLORIDE CRYS ER 20 MEQ PO TBCR
40.0000 meq | EXTENDED_RELEASE_TABLET | Freq: Once | ORAL | Status: AC
  Administered 2018-10-25: 40 meq via ORAL
  Filled 2018-10-25: qty 2

## 2018-10-25 MED ORDER — AMLODIPINE BESYLATE 10 MG PO TABS: 10.0000 mg | ORAL_TABLET | Freq: Every day | ORAL | 0 refills | Status: AC

## 2018-10-25 MED ORDER — POTASSIUM CHLORIDE CRYS ER 20 MEQ PO TBCR: 20.0000 meq | EXTENDED_RELEASE_TABLET | Freq: Every day | ORAL | 0 refills | Status: AC

## 2018-10-25 NOTE — Discharge Summary (Signed)
Physician Discharge Summary  Michael GuardianRichard Carson ZOX:[id number] DOB: 06/22/1955 DOA: 10/23/2018  PCP: Ignacia PalmaBeck, Mark C., MD  Admit date: 10/23/2018 Discharge date: 10/25/2018  Admitted From: Home Disposition:  Home  Discharge Condition:Stable CODE STATUS:FULL Diet recommendation: Heart Healthy   Brief/Interim Summary:  Patient is a 63 year old male with history of arthritis, hypertension who presents the emergency department complains of generalized weakness and falls.  Reported that he fell 4 times and hit the back of his head.  Denied any headache or neck pain on presentation.  Works as a Web designerfurniture discharge.  Patient reports that he drinks a lot of water.  He was reported by his PCP earlier that his sodium and potassium were low.  When he presented he was found to be mildly bradycardic with sodium of 117. Potassium of 2.7.  Patient admitted for further management. Potassium was supplemented.  He was found to be dehydrated so was started on normal saline with  improvement of sodium level.  His sodium today is 131.  He was seen by physical therapy and there was no recommendation for follow-up.  Patient is hemodynamically stable for discharge today to home.  Following problems were addressed during his hospitalization:   Severe hypotonic hypovolemic hyponatremia: Low plasma and urine osmolality indicating hypovolemic hyponatremia. Responded very well to the IV fluids normal saline. Sodium is 131 today.  No history of vomiting, diarrhea.  Was taking thiazide diuretic at home.  Will discontinue thiazide on discharge. Check BMP in a week.  Hypokalemia/hypomagnesemia: Supplemented and corrected  AKI: Secondary to dehydration.  Improved with IV fluids  Sinus bradycardia: Heart rate in the range of 50s.  Not on beta-blocker or calcium.    No heart block as per EKG.  Falls/generalized weakness: Requested physical therapy evaluation.  No follow-up recommended.  Hypertension: Monitor blood pressure.   Will stop thiazide and put him on amlodipine.  GERD: Continue PPI     Discharge Diagnoses:  Principal Problem:   Hyponatremia Active Problems:   Hypokalemia   Hypomagnesemia   Hypochloremia   AKI (acute kidney injury) [facility](HCC)    Discharge Instructions  Discharge Instructions    Diet - low sodium heart healthy   Complete by: As directed    Discharge instructions   Complete by: As directed    1)Please follow up with your PCP in a week. Do a BMP test during the follow up. 2)Take prescribed medications as instructed.We have discontinued lisinopril-hydrochlorothiazide. 3)Monitor your blood pressure. 4)Avoid drinking too much water.   Increase activity slowly   Complete by: As directed      Allergies as of 10/25/2018   No Known Allergies     Medication List    STOP taking these medications   lisinopril-hydrochlorothiazide 20-25 MG tablet Commonly known as: ZESTORETIC     TAKE these medications   ALPRAZolam 1 MG tablet Commonly known as: XANAX Take 1 mg by mouth 3 (three) times daily as needed for anxiety.   amLODipine 10 MG tablet Commonly known as: NORVASC Take 1 tablet (10 mg total) by mouth daily.   aspirin EC 81 MG tablet Take 81 mg by mouth daily.   Cymbalta 60 MG capsule Generic drug: DULoxetine Take 60 mg by mouth every morning.   multivitamin with minerals Tabs tablet Take 1 tablet by mouth daily.   pantoprazole 20 MG tablet Commonly known as: PROTONIX Take 40 mg by mouth daily.   potassium chloride SA 20 MEQ tablet Commonly known as: K-DUR Take 1 tablet (20 mEq total) by  mouth daily.      Follow-up Information    Ignacia PalmaBeck, Mark C., MD. Schedule an appointment as soon as possible for a visit in 1 week(s).   Specialty: Family Medicine Contact information: [address]  [address] [place] [address] [phone]          No Known Allergies  Consultations:  none   Procedures/Studies: Ct Head Wo Contrast  Result Date:  10/23/2018 CLINICAL DATA:  Altered LOC EXAM: CT HEAD WITHOUT CONTRAST TECHNIQUE: Contiguous axial images were obtained from the base of the skull through the vertex without intravenous contrast. COMPARISON:  None. FINDINGS: Brain: No acute territorial infarction, hemorrhage or intracranial mass. Patchy hypodensity in the periventricular and subcortical white matter consistent with small vessel ischemic change. Normal ventricle size. Vascular: No hyperdense vessels.  Carotid vascular calcification Skull: Normal. Negative for fracture or focal lesion. Sinuses/Orbits: No acute finding. Other: None IMPRESSION: 1. No CT evidence for acute intracranial abnormality. 2. Scattered hypodensity in the bilateral white matter suspected to represent small vessel ischemic change Electronically Signed   By: Jasmine PangKim  Fujinaga M.D.   On: 10/23/2018 22:18   Dg Chest Port 1 View  Result Date: 10/23/2018 CLINICAL DATA:  Weakness. EXAM: PORTABLE CHEST 1 VIEW COMPARISON:  CT dated June 29, 2008. FINDINGS: A metallic coil projects over the left mid chest. 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 There is slight increased density surrounding the AVM coils. There is no pneumothorax. No large pleural effusion. No acute osseous abnormality. The heart is stable. IMPRESSION: No active disease. Electronically Signed   By: Katherine Mantlehristopher  Green M.D.   On: 10/23/2018 21:15       Subjective:  Patient seen and examined the bedside this morning.  Hemodynamically stable.  Comfortable.  No active issues.  Stable for discharge  Discharge Exam: Vitals:   10/24/18 2309 10/25/18 0338  BP: (!) 148/65 (!) 149/62  Pulse: (!) 53 (!) 52  Resp: 12 15  Temp: 98.3 F (36.8 C) 98.1 F (36.7 C)  SpO2: 98% 97%   Vitals:   10/24/18 1502 10/24/18 1937 10/24/18 2309 10/25/18 0338  BP: 131/65 134/64 (!) 148/65 (!) 149/62  Pulse: (!) 50 (!) 57 (!) 53 (!) 52  Resp:  17 12 15   Temp: 98.9 F (37.2 C) 99 F  (37.2 C) 98.3 F (36.8 C) 98.1 F (36.7 C)  TempSrc: Oral Oral Oral Oral  SpO2: 96% 99% 98% 97%  Weight:      Height:        General: Pt is alert, awake, not in acute distress Cardiovascular: RRR, S1/S2 +, no rubs, no gallops Respiratory: CTA bilaterally, no wheezing, no rhonchi Abdominal: Soft, NT, ND, bowel sounds + Extremities: no edema, no cyanosis    The results of significant diagnostics from this hospitalization (including imaging, microbiology, ancillary and laboratory) are listed below for reference.     Microbiology: Recent Results (from the past 240 hour(s))  SARS Coronavirus 2 (CEPHEID - Performed [facility]), Hosp Order     Status: None   Collection Time: 10/23/18  9:18 PM   Specimen: Nasopharyngeal Swab  Result Value Ref Range Status   SARS Coronavirus 2 NEGATIVE NEGATIVE Final    Comment: (NOTE) If result is NEGATIVE SARS-CoV-2 target nucleic acids are NOT DETECTED. The SARS-CoV-2 RNA is generally detectable in upper and lower  respiratory specimens during the acute phase of infection. The lowest  concentration of SARS-CoV-2  viral copies this assay can detect is 250  copies / mL. A negative result does not preclude SARS-CoV-2 infection  and should not be used as the sole basis for treatment or other  patient management decisions.  A negative result may occur with  improper specimen collection / handling, submission of specimen other  than nasopharyngeal swab, presence of viral mutation(s) within the  areas targeted by this assay, and inadequate number of viral copies  (<250 copies / mL). A negative result must be combined with clinical  observations, patient history, and epidemiological information. If result is POSITIVE SARS-CoV-2 target nucleic acids are DETECTED. The SARS-CoV-2 RNA is generally detectable in upper and lower  respiratory specimens dur ing the acute phase of infection.  Positive  results are indicative of active  infection with SARS-CoV-2.  Clinical  correlation with patient history and other diagnostic information is  necessary to determine patient infection status.  Positive results do  not rule out bacterial infection or co-infection with other viruses. If result is PRESUMPTIVE POSTIVE SARS-CoV-2 nucleic acids MAY BE PRESENT.   A presumptive positive result was obtained on the submitted specimen  and confirmed on repeat testing.  While 2019 novel coronavirus  (SARS-CoV-2) nucleic acids may be present in the submitted sample  additional confirmatory testing may be necessary for epidemiological  and / or clinical management purposes  to differentiate between  SARS-CoV-2 and other Sarbecovirus currently known to infect humans.  If clinically indicated additional testing with an alternate test  methodology [phone]) is advised. The SARS-CoV-2 RNA is generally  detectable in upper and lower respiratory sp ecimens during the acute  phase of infection. The expected result is Negative. Fact Sheet for Patients:  StrictlyIdeas.no Fact Sheet for Healthcare Providers: BankingDealers.co.za This test is not yet approved or cleared by the [place] FDA and has been authorized for detection and/or diagnosis of SARS-CoV-2 by FDA under an Emergency Use Authorization (EUA).  This EUA will remain in effect (meaning this test can be used) for the duration of the COVID-19 declaration under Section 564(b)(1) of the Act, 21 U.S.C. section 360bbb-3(b)(1), unless the authorization is terminated or revoked sooner. Performed at [facility], [place] [address], [place], [place] [address]   MRSA PCR Screening     Status: None   Collection Time: 10/24/18  3:34 AM   Specimen: Nasal Mucosa; Nasopharyngeal  Result Value Ref Range Status   MRSA by PCR NEGATIVE NEGATIVE Final    Comment:        The GeneXpert MRSA Assay (FDA approved for NASAL specimens only), is one  component of a comprehensive MRSA colonization surveillance program. It is not intended to diagnose MRSA infection nor to guide or monitor treatment for MRSA infections. Performed at [facility], [place] [address], [place], [place] [address]      Labs: BNP (last 3 results) No results for input(s): BNP in the last 8760 hours. Basic Metabolic Panel: Recent Labs  Lab 10/24/18 0115 10/24/18 0201 10/24/18 0844 10/24/18 1621 10/25/18 0338  NA 120* 122* 127* 131* 131*  K 2.6* 3.0* 3.7 3.6 3.2*  CL 81* 82* 90* 94* 94*  CO2 26 29 27 27 28   GLUCOSE 98 98 182* 114* 89  BUN 14 13 11 11 10   CREATININE 1.31* 1.26* 1.11 0.85 0.86  CALCIUM 8.1* 8.5* 8.5* 9.1 9.1  MG  --  1.5*  --   --   --    Liver Function Tests: Recent Labs  Lab 10/23/18 2100  AST 47*  ALT 47*  ALKPHOS 115  BILITOT 1.2  PROT 6.5  ALBUMIN 3.8   Recent Labs  Lab 10/23/18 2100  LIPASE 21   No results for input(s): AMMONIA in the last 168 hours. CBC: Recent Labs  Lab 10/23/18 1943  WBC 8.8  HGB 13.7  HCT 37.2*  MCV 90.1  PLT 189   Cardiac Enzymes: No results for input(s): CKTOTAL, CKMB, CKMBINDEX, TROPONINI in the last 168 hours. BNP: Invalid input(s): POCBNP CBG: No results for input(s): GLUCAP in the last 168 hours. D-Dimer No results for input(s): DDIMER in the last 72 hours. Hgb A1c No results for input(s): HGBA1C in the last 72 hours. Lipid Profile No results for input(s): CHOL, HDL, LDLCALC, TRIG, CHOLHDL, LDLDIRECT in the last 72 hours. Thyroid function studies Recent Labs    10/24/18 0202  TSH 1.060   Anemia work up No results for input(s): VITAMINB12, FOLATE, FERRITIN, TIBC, IRON, RETICCTPCT in the last 72 hours. Urinalysis    Component Value Date/Time   COLORURINE STRAW (A) 10/24/2018 0115   APPEARANCEUR CLEAR 10/24/2018 0115   LABSPEC 1.003 (L) 10/24/2018 0115   PHURINE 6.0 10/24/2018 0115   GLUCOSEU NEGATIVE 10/24/2018 0115   HGBUR SMALL (A) 10/24/2018 0115    BILIRUBINUR NEGATIVE 10/24/2018 0115   KETONESUR NEGATIVE 10/24/2018 0115   PROTEINUR NEGATIVE 10/24/2018 0115   NITRITE NEGATIVE 10/24/2018 0115   LEUKOCYTESUR NEGATIVE 10/24/2018 0115   Sepsis Labs Invalid input(s): PROCALCITONIN,  WBC,  LACTICIDVEN Microbiology Recent Results (from the past 240 hour(s))  SARS Coronavirus 2 (CEPHEID - Performed in [facility] lab), Hosp Order     Status: None   Collection Time: 10/23/18  9:18 PM   Specimen: Nasopharyngeal Swab  Result Value Ref Range Status   SARS Coronavirus 2 NEGATIVE NEGATIVE Final    Comment: (NOTE) If result is NEGATIVE SARS-CoV-2 target nucleic acids are NOT DETECTED. The SARS-CoV-2 RNA is generally detectable in upper and lower  respiratory specimens during the acute phase of infection. The lowest  concentration of SARS-CoV-2 viral copies this assay can detect is 250  copies / mL. A negative result does not preclude SARS-CoV-2 infection  and should not be used as the sole basis for treatment or other  patient management decisions.  A negative result may occur with  improper specimen collection / handling, submission of specimen other  than nasopharyngeal swab, presence of viral mutation(s) within the  areas targeted by this assay, and inadequate number of viral copies  (<250 copies / mL). A negative result must be combined with clinical  observations, patient history, and epidemiological information. If result is POSITIVE SARS-CoV-2 target nucleic acids are DETECTED. The SARS-CoV-2 RNA is generally detectable in upper and lower  respiratory specimens dur ing the acute phase of infection.  Positive  results are indicative of active infection with SARS-CoV-2.  Clinical  correlation with patient history and other diagnostic information is  necessary to determine patient infection status.  Positive results do  not rule out bacterial infection or co-infection with other viruses. If result is PRESUMPTIVE  POSTIVE SARS-CoV-2 nucleic acids MAY BE PRESENT.   A presumptive positive result was obtained on the submitted specimen  and confirmed on repeat testing.  While 2019 novel coronavirus  (SARS-CoV-2) nucleic acids may be present in the submitted sample  additional confirmatory testing may be necessary for epidemiological  and / or clinical management purposes  to differentiate between  SARS-CoV-2 and other Sarbecovirus currently known  to infect humans.  If clinically indicated additional testing with an alternate test  methodology [phone]) is advised. The SARS-CoV-2 RNA is generally  detectable in upper and lower respiratory sp ecimens during the acute  phase of infection. The expected result is Negative. Fact Sheet for Patients:  BoilerBrush.com.cy Fact Sheet for Healthcare Providers: https://pope.com/ This test is not yet approved or cleared by the [place] FDA and has been authorized for detection and/or diagnosis of SARS-CoV-2 by FDA under an Emergency Use Authorization (EUA).  This EUA will remain in effect (meaning this test can be used) for the duration of the COVID-19 declaration under Section 564(b)(1) of the Act, 21 U.S.C. section 360bbb-3(b)(1), unless the authorization is terminated or revoked sooner. Performed at [facility], [address] [place], [place] [address]   MRSA PCR Screening     Status: None   Collection Time: 10/24/18  3:34 AM   Specimen: Nasal Mucosa; Nasopharyngeal  Result Value Ref Range Status   MRSA by PCR NEGATIVE NEGATIVE Final    Comment:        The GeneXpert MRSA Assay (FDA approved for NASAL specimens only), is one component of a comprehensive MRSA colonization surveillance program. It is not intended to diagnose MRSA infection nor to guide or monitor treatment for MRSA infections. Performed at [facility], [address], [place], [place] [address]     Please  note: You were cared for by a hospitalist during your hospital stay. Once you are discharged, your primary care physician will handle any further medical issues. Please note that NO REFILLS for any discharge medications will be authorized once you are discharged, as it is imperative that you return to your primary care physician (or establish a relationship with a primary care physician if you do not have one) for your post hospital discharge needs so that they can reassess your need for medications and monitor your lab values.    Time coordinating discharge: 40 minutes  SIGNED:   Burnadette Pop, MD  [facility] 10/25/2018, 8:12 AM Pager [phone]  If 7PM-7AM, please contact night-coverage [email or web address] Password [email or web address]

## 2018-10-25 NOTE — Progress Notes (Signed)
Physical Therapy Discharge Patient Details Name: Ariana Cavenaugh MRN: [id number] DOB: [birth year] Today's Date: 10/25/2018 Time: 7129-2909 PT Time Calculation (min) (ACUTE ONLY): 13 min  Patient discharged from PT services secondary to goals met and no further PT needs identified.  Please see latest therapy progress note for current level of functioning and progress toward goals.    Progress and discharge plan discussed with patient and/or caregiver: Patient/Caregiver agrees with plan  GP     Denice Paradise 10/25/2018, 10:19 AM  Amanda Cockayne Acute Rehabilitation Services Pager:  [phone]  Office:  [phone]

## 2018-10-25 NOTE — Progress Notes (Signed)
Patient being discharged per MD order. Patient given discharge instructions in written and Verbal format.

## 2018-10-25 NOTE — Progress Notes (Signed)
Physical Therapy Treatment and D/C Patient Details Name: Michael Carson MRN: [id number] DOB: 01-May-1955 Today's Date: 10/25/2018    History of Present Illness Michael Carson is a 63 y.o. male with medical history significant of arthritis, hypertension presenting to the hospital for evaluation of generalized weakness and falls.  Patient states for the past 1 week he has been feeling very weak and tired.  Today he fell 4 times and during 1 of the falls hit the back of his head.  Denies having any headaches or neck pain.  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     PT Comments    Pt admitted with above diagnosis. Pt currently without significant functional limitations at this time. No LOB with challenges to balance.   HR 74-104 bpm with activity. Met all goals and is probably discharging today. .  Pt tolerated well.  Will sign off.   Follow Up Recommendations  No PT follow up     Equipment Recommendations  None recommended by PT    Recommendations for Other Services       Precautions / Restrictions Precautions Precautions: Fall Restrictions Weight Bearing Restrictions: No    Mobility  Bed Mobility Overal bed mobility: Independent                Transfers Overall transfer level: Independent                  Ambulation/Gait Ambulation/Gait assistance: Independent Gait Distance (Feet): 650 Feet Assistive device: None Gait Pattern/deviations: WFL(Within Functional Limits)   Gait velocity interpretation: >2.62 ft/sec, indicative of community ambulatory General Gait Details: No issues with balance with challenges to balance.     Stairs             Wheelchair Mobility    Modified Rankin (Stroke Patients Only)       Balance Overall balance assessment: Independent                                          Cognition Arousal/Alertness: Awake/alert Behavior During Therapy: WFL for tasks  assessed/performed Overall Cognitive Status: Within Functional Limits for tasks assessed                                        Exercises      General Comments        Pertinent Vitals/Pain Pain Assessment: No/denies pain    Home Living                      Prior Function            PT Goals (current goals can now be found in the care plan section) Acute Rehab PT Goals Patient Stated Goal: to go home PT Goal Formulation: All assessment and education complete, DC therapy Progress towards PT goals: Goals met/education completed, patient discharged from PT    Frequency    Min 3X/week      PT Plan Current plan remains appropriate    Co-evaluation              AM-PAC PT "6 Clicks" Mobility   Outcome Measure  Help needed turning from your back to your side while in a flat bed without using bedrails?:  None Help needed moving from lying on your back to sitting on the side of a flat bed without using bedrails?: None Help needed moving to and from a bed to a chair (including a wheelchair)?: None Help needed standing up from a chair using your arms (e.g., wheelchair or bedside chair)?: None Help needed to walk in hospital room?: None Help needed climbing 3-5 steps with a railing? : None 6 Click Score: 24    End of Session Equipment Utilized During Treatment: Gait belt Activity Tolerance: Patient tolerated treatment well Patient left: in chair;with call bell/phone within reach Nurse Communication: Mobility status PT Visit Diagnosis: Muscle weakness (generalized) (M62.81)     Time: 5947-0761 PT Time Calculation (min) (ACUTE ONLY): 13 min  Charges:  $Gait Training: 8-22 mins                     [place] Pager:  [phone]  Office:  [place] 10/25/2018, 10:16 AM

## 2019-12-31 IMAGING — CT CT HEAD WITHOUT CONTRAST
4 series · 16 of 47 positions shown, 18 images · non-contrast
Comparison: None.

CLINICAL DATA: Altered LOC

EXAM:
CT HEAD WITHOUT CONTRAST
TECHNIQUE: Contiguous axial images were obtained from the base of the skull
through the vertex without intravenous contrast.

[Series 3: head wo · axial · 0.43mm/px · z∈[-82,+38]mm · 7 of 33 slices shown, 9 images]
[im 5/33  brain]
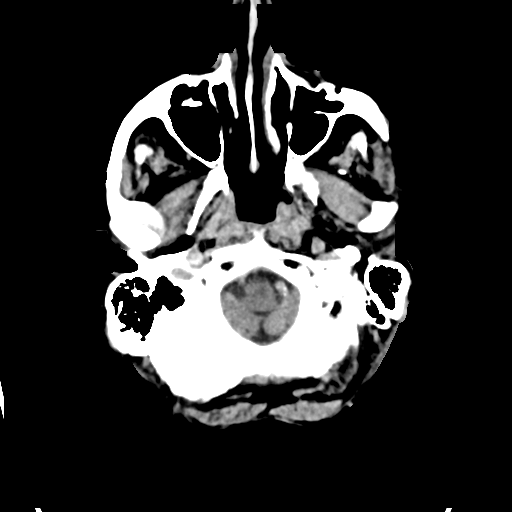
[im 5/33  bone]
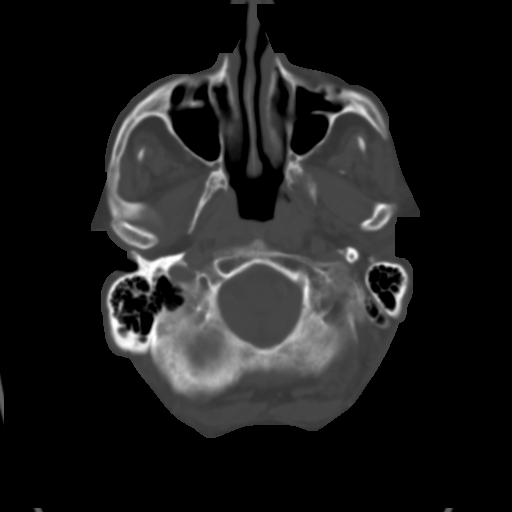
[im 9/33  brain]
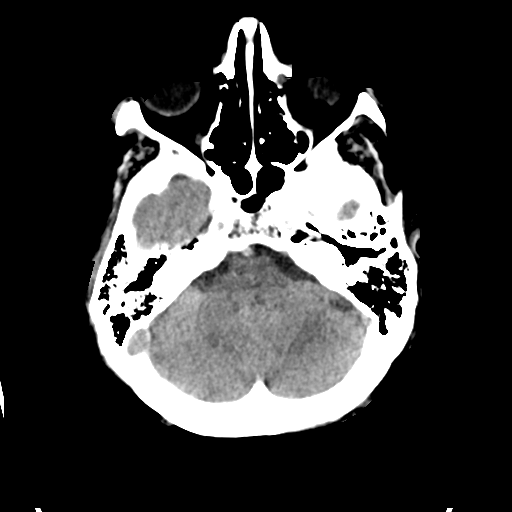
[im 13/33  brain]
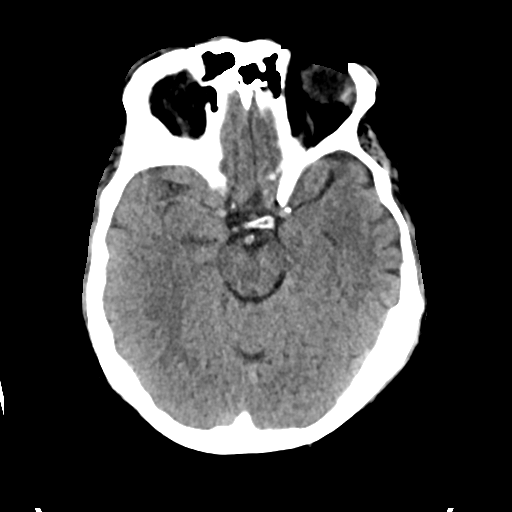
[im 17/33  brain]
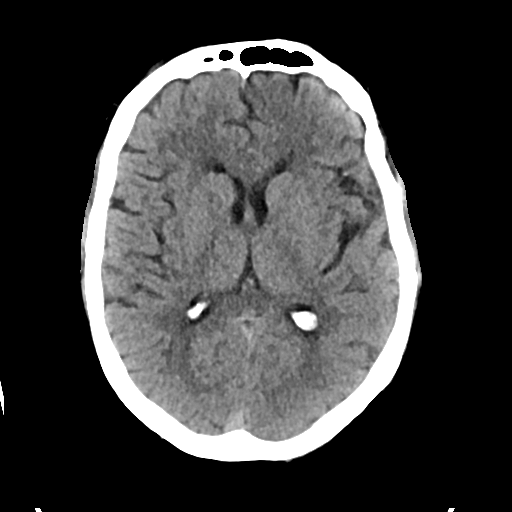
[im 21/33  brain]
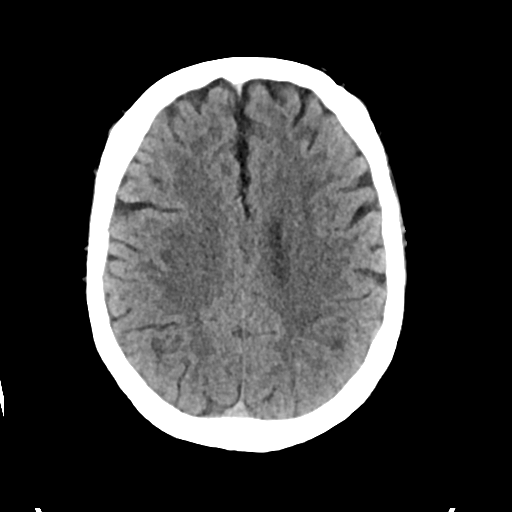
[im 21/33  bone]
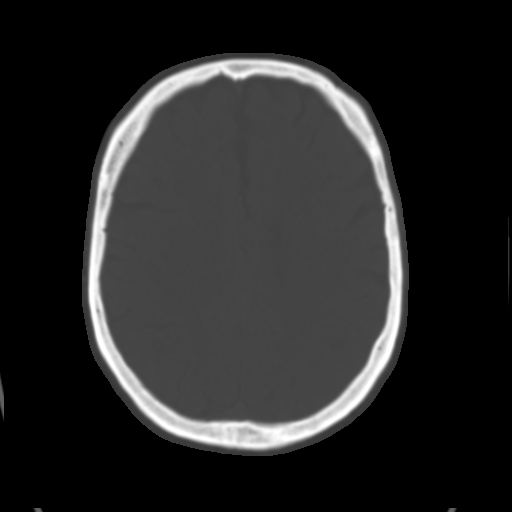
[im 25/33  brain]
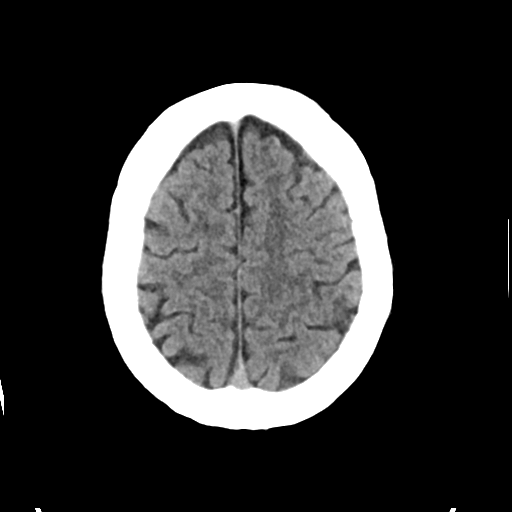
[im 29/33  brain]
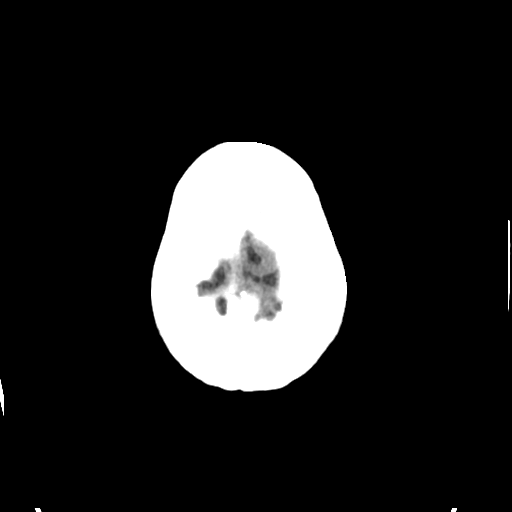

[Series 4: head bone · axial · 0.43mm/px · z∈[-86,-54]mm · 3 of 82 slices shown]
[im 9/82  bone]
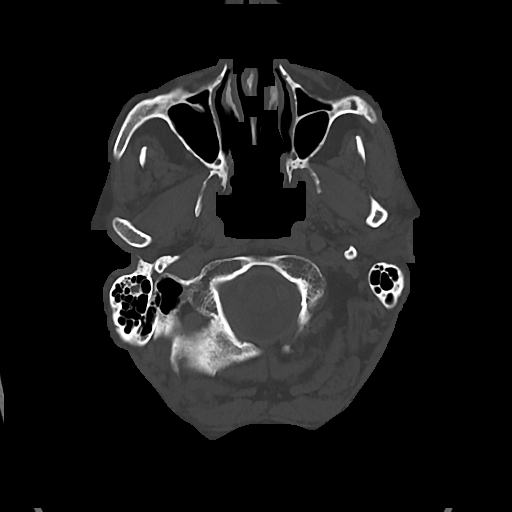
[im 17/82  bone]
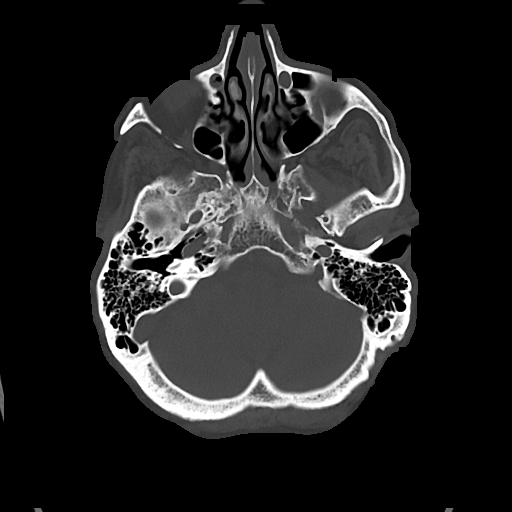
[im 25/82  bone]
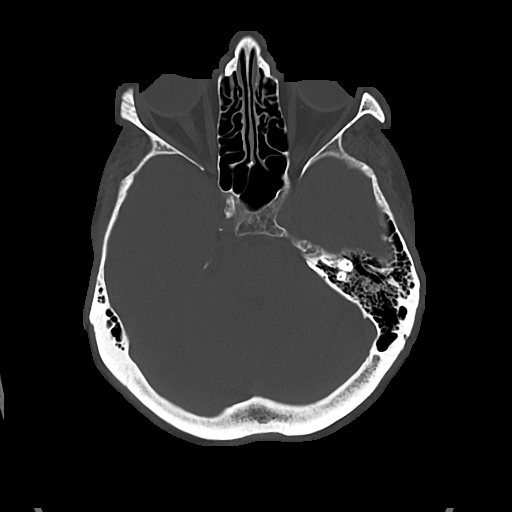

[Series 5: cor soft · coronal · 0.33mm/px · 3 of 68 slices shown]
[im 23/68  brain]
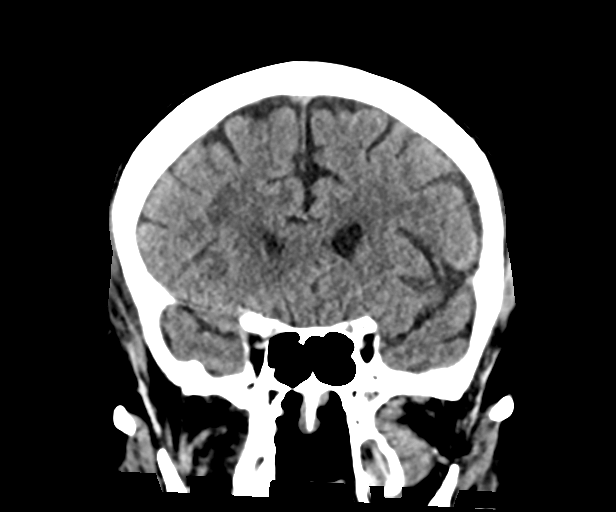
[im 30/68  brain]
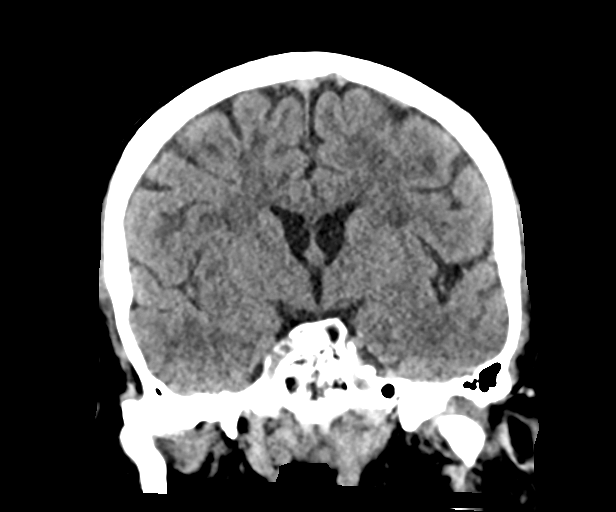
[im 38/68  brain]
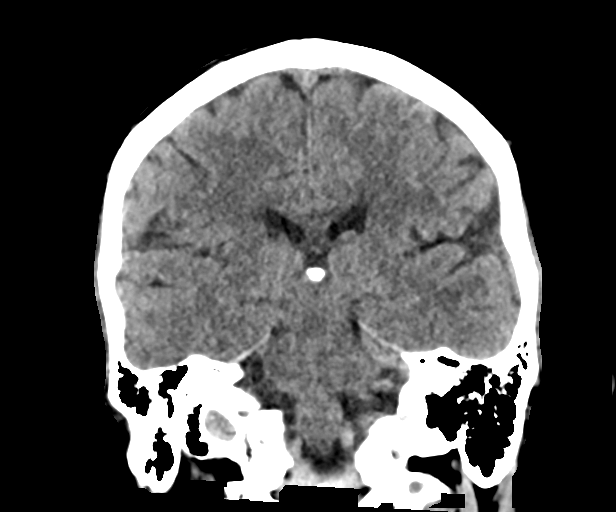

[Series 6: sag soft · sagittal · 0.33mm/px · 3 of 67 slices shown]
[im 23/67  brain]
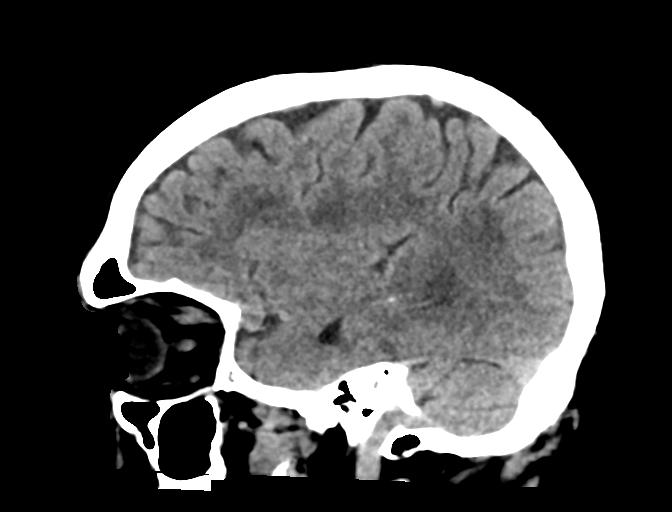
[im 34/67  brain]
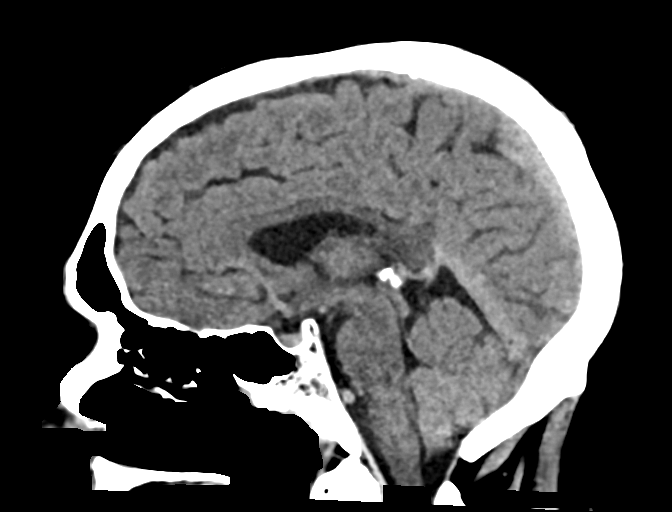
[im 45/67  brain]
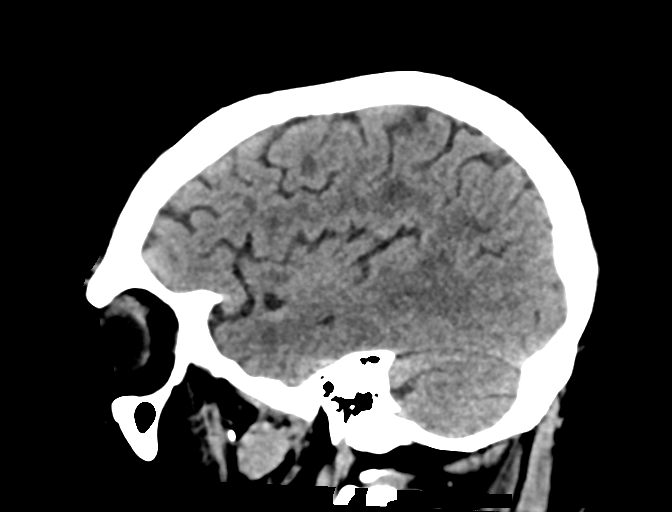

[16 of 47 positions shown; findings below may reference images not displayed]

FINDINGS: Brain: No acute territorial infarction, hemorrhage or intracranial
mass. Patchy hypodensity in the periventricular and subcortical
white matter consistent with small vessel ischemic change. Normal
ventricle size.

Vascular: No hyperdense vessels.  Carotid vascular calcification

Skull: Normal. Negative for fracture or focal lesion.

Sinuses/Orbits: No acute finding.

Other: None
IMPRESSION: 1. No CT evidence for acute intracranial abnormality.
2. Scattered hypodensity in the bilateral white matter suspected to
represent small vessel ischemic change

## 2024-06-25 ENCOUNTER — Ambulatory Visit: Admitting: Internal Medicine
